# Patient Record
Sex: Male | Born: 1975
Health system: Southern US, Community
[De-identification: ages and names within clinical notes are randomized; demographics above are authoritative.]

## PROBLEM LIST (undated history)

## (undated) DIAGNOSIS — E785 Hyperlipidemia, unspecified: Secondary | ICD-10-CM

## (undated) DIAGNOSIS — E782 Mixed hyperlipidemia: Secondary | ICD-10-CM

## (undated) DIAGNOSIS — F988 Other specified behavioral and emotional disorders with onset usually occurring in childhood and adolescence: Secondary | ICD-10-CM

## (undated) DIAGNOSIS — G4700 Insomnia, unspecified: Secondary | ICD-10-CM

## (undated) DIAGNOSIS — K219 Gastro-esophageal reflux disease without esophagitis: Secondary | ICD-10-CM

## (undated) DIAGNOSIS — I10 Essential (primary) hypertension: Secondary | ICD-10-CM

## (undated) DIAGNOSIS — J309 Allergic rhinitis, unspecified: Secondary | ICD-10-CM

## (undated) DIAGNOSIS — D509 Iron deficiency anemia, unspecified: Secondary | ICD-10-CM

## (undated) DIAGNOSIS — R7303 Prediabetes: Secondary | ICD-10-CM

## (undated) DIAGNOSIS — R001 Bradycardia, unspecified: Secondary | ICD-10-CM

## (undated) DIAGNOSIS — E079 Disorder of thyroid, unspecified: Secondary | ICD-10-CM

## (undated) DIAGNOSIS — I471 Supraventricular tachycardia, unspecified: Secondary | ICD-10-CM

## (undated) DIAGNOSIS — F32A Depression, unspecified: Secondary | ICD-10-CM

## (undated) DIAGNOSIS — E039 Hypothyroidism, unspecified: Secondary | ICD-10-CM

## (undated) HISTORY — DX: Hypothyroidism, unspecified: E03.9

## (undated) HISTORY — DX: Gastro-esophageal reflux disease without esophagitis: K21.9

## (undated) HISTORY — DX: Depression, unspecified: F32.A

## (undated) HISTORY — DX: Prediabetes: R73.03

## (undated) HISTORY — DX: Bradycardia, unspecified: R00.1

## (undated) HISTORY — DX: Disorder of thyroid, unspecified: E07.9

## (undated) HISTORY — DX: Essential (primary) hypertension: I10

## (undated) HISTORY — DX: Mixed hyperlipidemia: E78.2

## (undated) HISTORY — DX: Insomnia, unspecified: G47.00

## (undated) HISTORY — DX: Other specified behavioral and emotional disorders with onset usually occurring in childhood and adolescence: F98.8

## (undated) HISTORY — DX: Iron deficiency anemia, unspecified: D50.9

## (undated) HISTORY — DX: Supraventricular tachycardia, unspecified: I47.10

## (undated) HISTORY — DX: Hyperlipidemia, unspecified: E78.5

## (undated) HISTORY — DX: Allergic rhinitis, unspecified: J30.9

---

## 2003-11-17 ENCOUNTER — Encounter: Admission: RE | Admit: 2003-11-17 | Discharge: 2003-11-17 | Payer: Self-pay | Admitting: Family Medicine

## 2003-12-09 ENCOUNTER — Encounter: Admission: RE | Admit: 2003-12-09 | Discharge: 2003-12-09 | Payer: Self-pay | Admitting: Family Medicine

## 2003-12-23 ENCOUNTER — Encounter: Admission: RE | Admit: 2003-12-23 | Discharge: 2003-12-23 | Payer: Self-pay | Admitting: Family Medicine

## 2004-01-27 ENCOUNTER — Encounter: Admission: RE | Admit: 2004-01-27 | Discharge: 2004-01-27 | Payer: Self-pay | Admitting: Family Medicine

## 2004-01-27 ENCOUNTER — Encounter: Admission: RE | Admit: 2004-01-27 | Discharge: 2004-01-27 | Payer: Self-pay | Admitting: Sports Medicine

## 2004-03-03 ENCOUNTER — Encounter: Admission: RE | Admit: 2004-03-03 | Discharge: 2004-03-03 | Payer: Self-pay | Admitting: Family Medicine

## 2006-09-27 DIAGNOSIS — K219 Gastro-esophageal reflux disease without esophagitis: Secondary | ICD-10-CM | POA: Insufficient documentation

## 2006-09-27 DIAGNOSIS — E669 Obesity, unspecified: Secondary | ICD-10-CM | POA: Insufficient documentation

## 2006-09-27 DIAGNOSIS — J309 Allergic rhinitis, unspecified: Secondary | ICD-10-CM | POA: Insufficient documentation

## 2016-08-27 ENCOUNTER — Encounter (HOSPITAL_COMMUNITY): Payer: Self-pay | Admitting: Emergency Medicine

## 2016-08-27 ENCOUNTER — Emergency Department (HOSPITAL_COMMUNITY)
Admission: EM | Admit: 2016-08-27 | Discharge: 2016-08-27 | Disposition: A | Discharge status: Home / Self Care | Payer: Commercial Managed Care - PPO | Attending: Emergency Medicine | Admitting: Emergency Medicine

## 2016-08-27 ENCOUNTER — Emergency Department (HOSPITAL_COMMUNITY): Payer: Commercial Managed Care - PPO

## 2016-08-27 DIAGNOSIS — Y9289 Other specified places as the place of occurrence of the external cause: Secondary | ICD-10-CM | POA: Diagnosis not present

## 2016-08-27 DIAGNOSIS — Y9389 Activity, other specified: Secondary | ICD-10-CM | POA: Diagnosis not present

## 2016-08-27 DIAGNOSIS — M79676 Pain in unspecified toe(s): Secondary | ICD-10-CM | POA: Diagnosis not present

## 2016-08-27 DIAGNOSIS — W1849XA Other slipping, tripping and stumbling without falling, initial encounter: Secondary | ICD-10-CM | POA: Insufficient documentation

## 2016-08-27 DIAGNOSIS — S92511A Displaced fracture of proximal phalanx of right lesser toe(s), initial encounter for closed fracture: Secondary | ICD-10-CM | POA: Insufficient documentation

## 2016-08-27 DIAGNOSIS — Y999 Unspecified external cause status: Secondary | ICD-10-CM | POA: Diagnosis not present

## 2016-08-27 DIAGNOSIS — S93104A Unspecified dislocation of right toe(s), initial encounter: Secondary | ICD-10-CM | POA: Diagnosis not present

## 2016-08-27 DIAGNOSIS — S99921A Unspecified injury of right foot, initial encounter: Secondary | ICD-10-CM | POA: Diagnosis present

## 2016-08-27 DIAGNOSIS — S92501A Displaced unspecified fracture of right lesser toe(s), initial encounter for closed fracture: Secondary | ICD-10-CM

## 2016-08-27 DIAGNOSIS — S92911A Unspecified fracture of right toe(s), initial encounter for closed fracture: Secondary | ICD-10-CM | POA: Diagnosis not present

## 2016-08-27 MED ORDER — LIDOCAINE HCL (PF) 1 % IJ SOLN
5.0000 mL | Freq: Once | INTRAMUSCULAR | Status: AC
  Administered 2016-08-27: 5 mL
  Filled 2016-08-27: qty 5

## 2016-08-27 MED ORDER — HYDROCODONE-ACETAMINOPHEN 5-325 MG PO TABS
1.0000 | ORAL_TABLET | Freq: Once | ORAL | Status: AC
  Administered 2016-08-27: 1 via ORAL
  Filled 2016-08-27: qty 1

## 2016-08-27 MED ORDER — TRAMADOL HCL 50 MG PO TABS: 50.0000 mg | ORAL_TABLET | Freq: Four times a day (QID) | ORAL | 0 refills | Status: AC | PRN

## 2016-08-27 MED ORDER — DICLOFENAC SODIUM 50 MG PO TBEC: 50.0000 mg | DELAYED_RELEASE_TABLET | Freq: Two times a day (BID) | ORAL | 0 refills | Status: AC

## 2016-08-27 NOTE — ED Triage Notes (Signed)
Pt here from [facility] with right pinky toe injury after hitting it on a laundry basket

## 2016-08-27 NOTE — Progress Notes (Signed)
Orthopedic Tech Progress Note Patient Details:  Roger Shepherd Nov 15, 1975 [id number]  Ortho Devices Type of Ortho Device: Buddy tape, Postop shoe/boot Ortho Device/Splint Location: rle 4th and 5th toes Ortho Device/Splint Interventions: Ordered, Application   Karolee Stamps 08/27/2016, 8:57 PM

## 2016-08-27 NOTE — ED Provider Notes (Signed)
[facility] DEPT Provider Note   CSN: [id number] Arrival date & time: 08/27/16  1642   By signing my name below, I, Ryan Long and Hansel Feinstein , attest that this documentation has been prepared under the direction and in the presence of [place]. Janit Bern, NP Electronically Signed: Vergia Alcon and Hansel Feinstein , Scribe. 08/27/2016. 6:40 PM.   History   Chief Complaint Chief Complaint  Patient presents with  . Toe Pain    The history is provided by the patient and the spouse. No language interpreter was used.  Toe Pain  This is a new problem. The current episode started 6 to 12 hours ago. The problem occurs constantly. The problem has not changed since onset.Pertinent negatives include no headaches. The symptoms are aggravated by bending, walking and standing. The symptoms are relieved by rest. He has tried nothing for the symptoms. The treatment provided no relief.    HPI Comments:  Roger Shepherd is a 41 y.o. male who presents to the Emergency Department complaining of gradually worsening right pinky toe pain s/p injury that occurred this morning. The pt states he slipped while stretching near the ground, drug his toe underneath him and struck the laundry basket with his right pinky toe. He denies LOC or head injury. He is unable to ambulate post event without difficulty. He states his pain is worsened with ambulation, weight bearing and toe movement. Pt was seen at UC earlier today, had XR showing fracture and dislocation and was referred here for further management. He denies HA, numbness, additional injuries.   History reviewed. No pertinent past medical history.  Patient Active Problem List   Diagnosis Date Noted  . OBESITY, NOS 09/27/2006  . RHINITIS, ALLERGIC 09/27/2006  . GASTROESOPHAGEAL REFLUX, NO ESOPHAGITIS 09/27/2006    History reviewed. No pertinent surgical history.   Home Medications    Prior to Admission medications   Medication Sig Start Date End Date Taking?  Authorizing Provider  diclofenac (VOLTAREN) 50 MG EC tablet Take 1 tablet (50 mg total) by mouth 2 (two) times daily. 08/27/16   Braylen Denunzio Bunnie Pion, NP  traMADol (ULTRAM) 50 MG tablet Take 1 tablet (50 mg total) by mouth every 6 (six) hours as needed. 08/27/16   Nandini Bogdanski Bunnie Pion, NP    Family History History reviewed. No pertinent family history.  Social History Social History  Substance Use Topics  . Smoking status: Never Smoker  . Smokeless tobacco: Never Used  . Alcohol use No     Allergies   Patient has no known allergies.   Review of Systems Review of Systems  Gastrointestinal: Negative for nausea and vomiting.  Musculoskeletal: Positive for arthralgias (right 5th toe).  Skin: Negative for wound.  Neurological: Negative for numbness and headaches.     Physical Exam Updated Vital Signs BP 115/60   Pulse (!) 55   Temp 98 F (36.7 C) (Oral)   Resp 20   SpO2 100%   Physical Exam  Constitutional: He is oriented to person, place, and time. He appears well-developed and well-nourished. No distress.  HENT:  Head: Normocephalic.  Eyes: Conjunctivae are normal.  Neck: Neck supple.  Cardiovascular: Bradycardia present.   Pulmonary/Chest: Effort normal.  Musculoskeletal:       Right foot: There is decreased range of motion, tenderness, bony tenderness, swelling and deformity. There is normal capillary refill.       Feet:  There is tenderness with palpation of the right little toe. There is deformity noted at the  distal aspect.   Neurological: He is alert and oriented to person, place, and time.  Skin: Skin is warm and dry.  Psychiatric: He has a normal mood and affect. His behavior is normal.  Nursing note and vitals reviewed.    ED Treatments / Results  DIAGNOSTIC STUDIES:  Oxygen Saturation is 100% on RA, normal by my interpretation.    COORDINATION OF CARE:  6:33 PM Discussed treatment plan with pt at bedside and pt agreed to plan.  Labs (all labs ordered are  listed, but only abnormal results are displayed) Labs Reviewed - No data to display   Radiology Dg Toe 5th Right  Result Date: 08/27/2016 CLINICAL DATA:  Postreduction little toe fracture. EXAM: RIGHT FIFTH TOE COMPARISON:  08/27/2016 FINDINGS: An oblique/vertical fracture of the little toe proximal phalanx is unchanged with apex medial angulation. No subluxation or dislocation identified. IMPRESSION: Unchanged fracture of the little toe proximal phalanx. Electronically Signed   By: Margarette Canada M.D.   On: 08/27/2016 20:19    Procedures Reduction of dislocation Date/Time: 08/27/2016 7:07 PM Performed by: Ashley Murrain Authorized by: Ashley Murrain  Consent: Verbal consent obtained. Risks and benefits: risks, benefits and alternatives were discussed Patient understanding: patient states understanding of the procedure being performed Imaging studies: imaging studies available Required items: required blood products, implants, devices, and special equipment available Patient identity confirmed: verbally with patient and hospital-assigned identification number Preparation: Patient was prepped and draped in the usual sterile fashion. Local anesthesia used: yes Anesthesia: digital block  Anesthesia: Local anesthesia used: yes Local Anesthetic: lidocaine 1% without epinephrine Anesthetic total: 4 mL  Sedation: Patient sedated: no Patient tolerance: Patient tolerated the procedure well with no immediate complications Comments: Dislocation reduced without difficulty     Medications Ordered in ED Medications  lidocaine (PF) (XYLOCAINE) 1 % injection 5 mL (5 mLs Other Given 08/27/16 1909)  HYDROcodone-acetaminophen (NORCO/VICODIN) 5-325 MG per tablet 1 tablet (1 tablet Oral Given 08/27/16 2052)     Initial Impression / Assessment and Plan / ED Course  I have reviewed the triage vital signs and the nursing notes.  Pertinent imaging results that were available during my care of the  patient were reviewed by me and considered in my medical decision making (see chart for details).     Patient X-Ray from UC shows oblique fracture though the distal aspect of the fifth proximal phalanx. Mild angulation at the fracture site is noted. Reduction performed in the Ed. Repeat XR shows oblique/vertical fracture of the little toe proximal phalanx is unchanged with apex medial angulation. No subluxation or dislocation identified. Pt advised to follow up with orthopedics. Patient given post-op shoe while in ED, conservative therapy recommended and discussed. Patient will be discharged home & is agreeable with above plan. Returns precautions discussed. Pt appears safe for discharge.   Final Clinical Impressions(s) / ED Diagnoses   Final diagnoses:  Fracture of fifth toe, right, closed, initial encounter  Closed dislocation of fifth toe of right foot, initial encounter    New Prescriptions Discharge Medication List as of 08/27/2016  8:38 PM    START taking these medications   Details  diclofenac (VOLTAREN) 50 MG EC tablet Take 1 tablet (50 mg total) by mouth 2 (two) times daily., Starting Sun 08/27/2016, Print    traMADol (ULTRAM) 50 MG tablet Take 1 tablet (50 mg total) by mouth every 6 (six) hours as needed., Starting Sun 08/27/2016, Print       I personally performed the  services described in this documentation, which was scribed in my presence. The recorded information has been reviewed and is accurate.     [address] [place], [place] 08/28/16 [id number]    Pattricia Boss, MD 08/28/16 [phone]

## 2016-08-27 NOTE — ED Notes (Addendum)
Patient transported to X-ray 

## 2016-08-27 NOTE — ED Notes (Signed)
Pt understood dc material. NAD noted. Scripts given at dc 

## 2016-08-28 ENCOUNTER — Encounter (INDEPENDENT_AMBULATORY_CARE_PROVIDER_SITE_OTHER): Payer: Self-pay | Admitting: Orthopaedic Surgery

## 2016-08-28 ENCOUNTER — Ambulatory Visit (INDEPENDENT_AMBULATORY_CARE_PROVIDER_SITE_OTHER): Payer: Commercial Managed Care - PPO | Admitting: Orthopaedic Surgery

## 2016-08-28 DIAGNOSIS — S92501A Displaced unspecified fracture of right lesser toe(s), initial encounter for closed fracture: Secondary | ICD-10-CM

## 2016-08-28 NOTE — Progress Notes (Signed)
Office Visit Note   Patient: Roger Shepherd           Date of Birth: May 13, 1976           MRN: [id number] Visit Date: 08/28/2016              Requested by: Michel Harrow, PA-C [place], [place] [address] PCP: Michel Harrow, PA-C   Assessment & Plan: Visit Diagnoses:  1. Closed fracture of phalanx of right fifth toe, initial encounter     Plan: We'll plan on trying this nonoperatively with buddy taping to the fourth toe. Weight-bear as tolerated in a postop shoe. Follow-up in 4 weeks for recheck. No x-rays needed unless he is having issues.  Follow-Up Instructions: Return in about 4 weeks (around 09/25/2016).   Orders:  No orders of the defined types were placed in this encounter.  No orders of the defined types were placed in this encounter.     Procedures: No procedures performed   Clinical Data: No additional findings.   Subjective: Chief Complaint  Patient presents with  . Right 5th Toe - Pain, Fracture    Roger Shepherd is a 41 yo male who presents with a chief complaint of right 5th toe fracture. Patient injured his toe on 08/27/16 while stretching. He initially presented to an Urgent Care and was sent to the ED for 5th toe dislocation and fracture. Dislocation was reduced in the ED. He ranks the pain as high as a 5/10, at its worst when walking. Denies numbness but endorses some tingling and burning on the lateral aspect of the 5th toe. He has taken Vicodin and Ibuprofen with some relief. He is currently wearing a post-op shoe.     Review of Systems Complete review of systems negative except for history of present illness  Objective: Vital Signs: There were no vitals taken for this visit.  Physical Exam  Constitutional: He is oriented to person, place, and time. He appears well-developed and well-nourished.  HENT:  Head: Normocephalic and atraumatic.  Eyes: Pupils are equal, round, and reactive to light.  Neck: Neck  supple.  Pulmonary/Chest: Effort normal.  Abdominal: Soft.  Musculoskeletal: Normal range of motion.  Neurological: He is alert and oriented to person, place, and time.  Skin: Skin is warm.  Psychiatric: He has a normal mood and affect. His behavior is normal. Judgment and thought content normal.  Nursing note and vitals reviewed.   Ortho Exam Exam of the right fifth toe shows overall clinically straight the toe is neuro vascularly intact. Specialty Comments:  No specialty comments available.  Imaging: Dg Toe 5th Right  Result Date: 08/27/2016 CLINICAL DATA:  Postreduction little toe fracture. EXAM: RIGHT FIFTH TOE COMPARISON:  08/27/2016 FINDINGS: An oblique/vertical fracture of the little toe proximal phalanx is unchanged with apex medial angulation. No subluxation or dislocation identified. IMPRESSION: Unchanged fracture of the little toe proximal phalanx. Electronically Signed   By: Margarette Canada M.D.   On: 08/27/2016 20:19     PMFS History: Patient Active Problem List   Diagnosis Date Noted  . Closed fracture of fifth toe of right foot 08/28/2016  . OBESITY, NOS 09/27/2006  . RHINITIS, ALLERGIC 09/27/2006  . GASTROESOPHAGEAL REFLUX, NO ESOPHAGITIS 09/27/2006   History reviewed. No pertinent past medical history.  History reviewed. No pertinent family history.  History reviewed. No pertinent surgical history. Social History   Occupational History  . Not on file.   Social History Main Topics  .  Smoking status: Never Smoker  . Smokeless tobacco: Never Used  . Alcohol use No  . Drug use: No  . Sexual activity: Not on file

## 2016-08-31 ENCOUNTER — Ambulatory Visit (INDEPENDENT_AMBULATORY_CARE_PROVIDER_SITE_OTHER): Payer: Self-pay | Admitting: Orthopedic Surgery

## 2016-09-08 DIAGNOSIS — K219 Gastro-esophageal reflux disease without esophagitis: Secondary | ICD-10-CM | POA: Diagnosis not present

## 2016-09-08 DIAGNOSIS — Z0000 Encounter for general adult medical examination without abnormal findings: Secondary | ICD-10-CM | POA: Diagnosis not present

## 2016-09-08 DIAGNOSIS — J309 Allergic rhinitis, unspecified: Secondary | ICD-10-CM | POA: Diagnosis not present

## 2016-09-25 ENCOUNTER — Encounter (INDEPENDENT_AMBULATORY_CARE_PROVIDER_SITE_OTHER): Payer: Self-pay | Admitting: Orthopaedic Surgery

## 2016-09-25 ENCOUNTER — Ambulatory Visit (INDEPENDENT_AMBULATORY_CARE_PROVIDER_SITE_OTHER): Payer: Commercial Managed Care - PPO | Admitting: Orthopaedic Surgery

## 2016-09-25 DIAGNOSIS — S92501A Displaced unspecified fracture of right lesser toe(s), initial encounter for closed fracture: Secondary | ICD-10-CM

## 2016-09-25 NOTE — Progress Notes (Signed)
Patient f/u today for right 5th toe fracture.  Doing well.  Some mild ache.  Toe is non swollen and nonttp.  Will wean postop shoe and buddy taping.  F/u prn.  Increase activity as tolerated.

## 2016-11-06 DIAGNOSIS — H5213 Myopia, bilateral: Secondary | ICD-10-CM | POA: Diagnosis not present

## 2017-09-10 DIAGNOSIS — J309 Allergic rhinitis, unspecified: Secondary | ICD-10-CM | POA: Diagnosis not present

## 2017-09-10 DIAGNOSIS — Z23 Encounter for immunization: Secondary | ICD-10-CM | POA: Diagnosis not present

## 2017-09-10 DIAGNOSIS — E782 Mixed hyperlipidemia: Secondary | ICD-10-CM | POA: Diagnosis not present

## 2017-09-10 DIAGNOSIS — Z136 Encounter for screening for cardiovascular disorders: Secondary | ICD-10-CM | POA: Diagnosis not present

## 2017-09-10 DIAGNOSIS — Z0001 Encounter for general adult medical examination with abnormal findings: Secondary | ICD-10-CM | POA: Diagnosis not present

## 2017-11-06 DIAGNOSIS — H524 Presbyopia: Secondary | ICD-10-CM | POA: Diagnosis not present

## 2017-11-06 DIAGNOSIS — H5213 Myopia, bilateral: Secondary | ICD-10-CM | POA: Diagnosis not present

## 2018-03-04 DIAGNOSIS — M79672 Pain in left foot: Secondary | ICD-10-CM | POA: Diagnosis not present

## 2018-03-05 ENCOUNTER — Encounter (INDEPENDENT_AMBULATORY_CARE_PROVIDER_SITE_OTHER): Payer: Self-pay | Admitting: Physician Assistant

## 2018-03-05 ENCOUNTER — Ambulatory Visit (INDEPENDENT_AMBULATORY_CARE_PROVIDER_SITE_OTHER): Payer: 59 | Admitting: Physician Assistant

## 2018-03-05 ENCOUNTER — Ambulatory Visit (INDEPENDENT_AMBULATORY_CARE_PROVIDER_SITE_OTHER): Payer: Self-pay

## 2018-03-05 DIAGNOSIS — M79672 Pain in left foot: Secondary | ICD-10-CM | POA: Diagnosis not present

## 2018-03-05 MED ORDER — TRAMADOL HCL 50 MG PO TABS
ORAL_TABLET | ORAL | 0 refills | Status: AC
Start: 2018-03-05 — End: ?

## 2018-03-05 NOTE — Progress Notes (Signed)
   Office Visit Note   Patient: Roger Shepherd           Date of Birth: 11-17-1975           MRN: [id number] Visit Date: 03/05/2018              Requested by: Michel Harrow, PA-C [place], [place] [address] PCP: Michel Harrow, PA-C   Assessment & Plan: Visit Diagnoses:  1. Pain in left foot     Plan: Impression is questionable fourth metatarsal shaft fracture versus sprain of the foot.  We will place the patient in a cam walker weightbearing as tolerated.  He will ice and elevate for pain and swelling.  I have called in a prescription of tramadol.  He does note that he is leaving for [place] in approximately 3 weeks and will follow up with [place] just prior to his departure.  Call if concerns or questions in the meantime  Follow-Up Instructions: Return in about 3 weeks (around 03/26/2018).   Orders:  Orders Placed This Encounter  Procedures  . XR Foot Complete Left   Meds ordered this encounter  Medications  . traMADol (ULTRAM) 50 MG tablet    Sig: TAKE 1-2 TABS PO Q6-8 HOURS PRN PAIN    Dispense:  30 tablet    Refill:  0      Procedures: No procedures performed   Clinical Data: No additional findings.   Subjective: Chief Complaint  Patient presents with  . Left Foot - Pain    HPI patient is a pleasant 42 year old gentleman who presents our clinic today with left foot pain.  He notes that he was running this past Sunday night when he inverted his left ankle.  He had immediate pain and swelling to the left lateral foot.  He has been weightbearing utilizing a postoperative shoe and crutches.  Having pain with this.  He describes this as a constant throb.  He has been taking Tylenol without any relief of symptoms.  No numbness, tingling or burning.  Review of Systems as detailed in HPI.  All others reviewed and are negative.   Objective: Vital Signs: There were no vitals taken for this visit.  Physical Exam well-developed well-nourished  gentleman no acute distress.  Alert and oriented x3.  Ortho Exam examination of the left foot reveals marked tenderness to the fourth metatarsal shaft.  Minimal tenderness to the peroneal tendon.  Minimal tenderness to the ATFL.  No bony tenderness.  Full range of motion of the ankle.  He is neurovascular intact distally.  Specialty Comments:  No specialty comments available.  Imaging: Xr Foot Complete Left  Result Date: 03/05/2018 Questionable nondisplaced fracture fourth metatarsal shaft    PMFS History: Patient Active Problem List   Diagnosis Date Noted  . Pain in left foot 03/05/2018  . Closed fracture of fifth toe of right foot 08/28/2016  . OBESITY, NOS 09/27/2006  . RHINITIS, ALLERGIC 09/27/2006  . GASTROESOPHAGEAL REFLUX, NO ESOPHAGITIS 09/27/2006   History reviewed. No pertinent past medical history.  History reviewed. No pertinent family history.  History reviewed. No pertinent surgical history. Social History   Occupational History  . Not on file  Tobacco Use  . Smoking status: Never Smoker  . Smokeless tobacco: Never Used  Substance and Sexual Activity  . Alcohol use: No  . Drug use: No  . Sexual activity: Not on file

## 2018-03-26 ENCOUNTER — Ambulatory Visit (INDEPENDENT_AMBULATORY_CARE_PROVIDER_SITE_OTHER): Payer: 59

## 2018-03-26 ENCOUNTER — Ambulatory Visit (INDEPENDENT_AMBULATORY_CARE_PROVIDER_SITE_OTHER): Payer: 59 | Admitting: Physician Assistant

## 2018-03-26 ENCOUNTER — Encounter (INDEPENDENT_AMBULATORY_CARE_PROVIDER_SITE_OTHER): Payer: Self-pay | Admitting: Orthopaedic Surgery

## 2018-03-26 DIAGNOSIS — M79672 Pain in left foot: Secondary | ICD-10-CM | POA: Diagnosis not present

## 2018-03-26 MED ORDER — DICLOFENAC SODIUM 1 % TD GEL
2.0000 g | Freq: Four times a day (QID) | TRANSDERMAL | 1 refills | Status: AC
Start: 2018-03-26 — End: ?

## 2018-03-26 NOTE — Progress Notes (Signed)
   Office Visit Note   Patient: Roger Shepherd           Date of Birth: Dec 10, 1975           MRN: [id number] Visit Date: 03/26/2018              Requested by: Michel Harrow, PA-C [place], [place] [address] PCP: Michel Harrow, PA-C   Assessment & Plan: Visit Diagnoses:  1. Pain in left foot     Plan: At this point, we will transition the patient from his cam walker into an ASO.  He will remain weightbearing as tolerated.  He will elevate for swelling.  He will follow-up with [place] as needed.  Call with concerns or questions.  Follow-Up Instructions: Return if symptoms worsen or fail to improve.   Orders:  Orders Placed This Encounter  Procedures  . XR Foot Complete Left   No orders of the defined types were placed in this encounter.     Procedures: No procedures performed   Clinical Data: No additional findings.   Subjective: Chief Complaint  Patient presents with  . Left Foot - Pain    HPI patient is a pleasant 42 year old gentleman who presents to our clinic today for recheck of his left foot.  Questionable left foot sprain versus fourth metatarsal shaft fracture about 3 weeks ago.  He is placed in a cam walker [place] weightbearing as tolerated.  He has much improved pain over the past few weeks.  He has tried walking around at his house barefoot without any issues.  He does note occasional pain along the peroneal attachment at the navicular.  Overall, doing much better.  Review of Systems as detailed in HPI.  All others reviewed and are negative.   Objective: Vital Signs: There were no vitals taken for this visit.  Physical Exam well-developed well-nourished gentleman no acute distress.  Alert and oriented x3.  Ortho Exam examination of the left foot reveals no swelling.  Full range of motion.  Mild tenderness along the peroneal tendon.  No tenderness along the fourth metatarsal.  He is neurovascularly intact distally.  Specialty  Comments:  No specialty comments available.  Imaging: Xr Foot Complete Left  Result Date: 03/26/2018 No acute or structural abnormalities    PMFS History: Patient Active Problem List   Diagnosis Date Noted  . Pain in left foot 03/05/2018  . Closed fracture of fifth toe of right foot 08/28/2016  . OBESITY, NOS 09/27/2006  . RHINITIS, ALLERGIC 09/27/2006  . GASTROESOPHAGEAL REFLUX, NO ESOPHAGITIS 09/27/2006   History reviewed. No pertinent past medical history.  History reviewed. No pertinent family history.  History reviewed. No pertinent surgical history. Social History   Occupational History  . Not on file  Tobacco Use  . Smoking status: Never Smoker  . Smokeless tobacco: Never Used  Substance and Sexual Activity  . Alcohol use: No  . Drug use: No  . Sexual activity: Not on file

## 2018-05-05 DIAGNOSIS — Z23 Encounter for immunization: Secondary | ICD-10-CM | POA: Diagnosis not present

## 2018-09-08 DIAGNOSIS — J019 Acute sinusitis, unspecified: Secondary | ICD-10-CM | POA: Diagnosis not present

## 2018-09-08 DIAGNOSIS — J9801 Acute bronchospasm: Secondary | ICD-10-CM | POA: Diagnosis not present

## 2018-09-23 DIAGNOSIS — R718 Other abnormality of red blood cells: Secondary | ICD-10-CM | POA: Diagnosis not present

## 2018-09-23 DIAGNOSIS — E782 Mixed hyperlipidemia: Secondary | ICD-10-CM | POA: Diagnosis not present

## 2018-09-23 DIAGNOSIS — E669 Obesity, unspecified: Secondary | ICD-10-CM | POA: Diagnosis not present

## 2018-09-23 DIAGNOSIS — R7303 Prediabetes: Secondary | ICD-10-CM | POA: Diagnosis not present

## 2018-09-23 DIAGNOSIS — Z0000 Encounter for general adult medical examination without abnormal findings: Secondary | ICD-10-CM | POA: Diagnosis not present

## 2018-09-23 DIAGNOSIS — R7301 Impaired fasting glucose: Secondary | ICD-10-CM | POA: Diagnosis not present

## 2018-10-03 ENCOUNTER — Encounter: Payer: Self-pay | Admitting: Dietician

## 2018-10-03 ENCOUNTER — Encounter: Payer: 59 | Attending: Family Medicine | Admitting: Dietician

## 2018-10-03 DIAGNOSIS — E669 Obesity, unspecified: Secondary | ICD-10-CM | POA: Diagnosis not present

## 2018-10-03 NOTE — Patient Instructions (Addendum)
Goals:   To drink soda no more than 3 times per week.   To order water (in addition to soda) at restaurants and start with the water first, then only drink soda if needed for flavor.   To order the entree only at fast food breakfast and bring your own side (fruit) and sugar-free lemonade flavor packet for water.   To look up new, easy recipes that create the "MyPlate." Include non-starchy vegetables, protein, and complex carbohydrates (whole grains, whole fruits, and low-fat dairy.)   Use your handouts from today for help/guidance. Let me know if you have any questions! See you at your follow up in about 2 months.

## 2018-10-03 NOTE — Progress Notes (Signed)
Medical Nutrition Therapy  Appt Start Time: 8:10am End Time: 9:10am   Primary concerns today: prediabetes, weight management   Preferred learning style: no preference indicated Learning readiness: ready   NUTRITION ASSESSMENT   Anthropometrics  Pt declined; taken from 09/23/2018:  Weight: 232 lbs  Height: 69 in  BMI: 34.3 kg/m2   Clinical Medical Hx: high cholesterol, impaired fasting glucose/prediabetes (no family hx), HTN, GERD, obesity, depression Surgeries: N/A Allergies: N/A Medications: see list  Psychosocial/Lifestyle Pt is not married nor have children. 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 Pt states he was told a couple of weeks ago at his doctor appointment that he is within the prediabetes range based on his fasting BG level. Pt was well prepared for the visit and asked many questions, including expressing his concern over his elevated BG.    24-Hr Dietary Recall First Meal: whole wheat english muffin (w/ sausage + cheese + egg) + banana + Coke Snack: candy  Second Meal: whole wheat ham & cheese sandwich + banana + chips + Coke  Snack: candy  Third Meal: pizza (or [employer])  Snack: usually skips  Beverages: Coke, water    Food & Nutrition Related Hx Dietary Hx: Likes eggs, sandwiches, chips, crunchy foods. Doesn't like oatmeal, fish, salad. Shops at [employer]. Pt states he uses whole grain bread for sandwiches. Pt states he drinks 2 Cokes/day in addition to lots of water. Pt states he experiences heartburn after eating a lot of sugar.  Estimated Daily Fluid Intake: 100+ oz Supplements: vitamin D, calcium, magnesium  GI / Other Notable Symptoms: heartburn   Physical Activity  Current average weekly physical activity: running 2x/week, yoga 2x/week   Estimated Energy Needs Calories: 2200 Carbohydrate: 248g Protein: 165g Fat:  61g   NUTRITION DIAGNOSIS  Food and nutrition related knowledge deficit (NB-1.1) related to limited prior nutrition-related education as evidenced by verbalized incomplete information and no prior knowledge of need for nutrition related recommendations.    NUTRITION INTERVENTION  Nutrition education (E-1) on the following topics:  . General healthful eating: MyPlate, food groups, variety, balanced eating, meal/snack ideas  . Prediabetes: what it is, how to manage with food/ exercise/ lifestyle   Handouts Provided Include   MyPlate   Meal Ideas   Breakfast Ideas   Sip Smarter   Diabetes Types  Learning Style & Readiness for Change Teaching method utilized: Visual & Auditory  Demonstrated degree of understanding via: Teach Back  Barriers to learning/adherence to lifestyle change: None Identified   Goals Established by Pt  To drink soda no more than 3 times per week.   To order water (in addition to soda) at restaurants and start with the water first, then only drink soda if needed for flavor.   To order the entree only at fast food breakfast and bring your own side (fruit) and sugar-free lemonade flavor packet for water.   To look up new, easy recipes that create the "MyPlate." Include non-starchy vegetables, protein, and complex carbohydrates (whole grains, whole fruits, and low-fat dairy.)    MONITORING & EVALUATION Dietary intake, weekly physical activity, and goals in 2 months.  RD's Notes for Next Visit  . Handouts: Balanced Snacks, Sugary Beverages  . Teach more about complex carbohydrates and amounts/portions  . Stress  Next Steps  Patient is to return to [facility] in about 2 months for  follow up appointment.

## 2018-10-15 DIAGNOSIS — M546 Pain in thoracic spine: Secondary | ICD-10-CM | POA: Diagnosis not present

## 2018-12-05 ENCOUNTER — Encounter: Payer: 59 | Attending: Family Medicine | Admitting: Dietician

## 2018-12-05 VITALS — Ht 69.0 in | Wt 213.0 lb

## 2018-12-05 DIAGNOSIS — E669 Obesity, unspecified: Secondary | ICD-10-CM | POA: Insufficient documentation

## 2018-12-05 NOTE — Patient Instructions (Signed)
   Review the "Types of Sugar" handout attached for more information on different types and sources of sugar and how they may impact the body.   Keep desserts and other sweets in moderation, no more than 2-3 times/week. Remember to stick to serving sizes as well and focus on balancing sweet treats out with other foods you eat during the day. If you want to try low-calorie versions of desserts (such as ice cream) this could be a way to incorporate them more often as well.   An appropriate weight loss rate is between 0.5 and 1.5 pounds per week, so be careful and don't rush. Remember there are a lot of things that make up and impact our body weight, including our food/beverage intake, physical activity, stress, water, etc. Fluctuations are normal!   If you need some snack ideas, see the "Balanced Snack" handout attached. These are good go-to options to have at the house if you need something to hold you over between meals. They pair up to provide good sources of both carbohydrates and protein.   For some of our carbohydrate foods, whole grain versions are best. Here are some examples:   Whole grains are best when it comes to bread, and if you are wanting to cut back on the amount that you eat try "Sandwich Thins" as a way to enjoy sandwiches without as much bread.   Pastas also have whole-grain versions as well as noodles made out of beans, chickpeas, and even vegetables like zucchini.   Brown rice is the preferred, whole-grain version over white rice, and there is also cauliflower rice.   Many foods have these substitutions as a way to get in more beans and vegetables, which will help increase fiber intake!     See you in about 4 months at your next follow up!

## 2018-12-05 NOTE — Progress Notes (Signed)
Medical Nutrition Therapy  Follow-Up Visit Appt Start Time: 8:10am End Time: 8:40am  Telephone Visit This visit was completed via telephone due to the COVID-19 pandemic.   I spoke with Arlyss Queen and verified that I was speaking with the correct person with two patient identifiers (full name and date of birth).   I discussed the limitations related to this kind of visit and the patient is willing to proceed.  Primary concerns today: prediabetes, weight management    NUTRITION ASSESSMENT   Anthropometrics  Weight: 213 lbs (-19 lbs in last 2 months)  Height: 69 in BMI: 31.45  Clinical Medical Hx: high cholesterol, impaired fasting glucose/prediabetes (no family hx), HTN, GERD, obesity, depression  Psychosocial/Lifestyle Pt is not married nor has children. 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 Pt states he was told in March 2020 at his doctor appointment that he is within the prediabetes range based on his fasting BG level. Pt states he likes having benchmarks and goals to work towards to track his progress over time.   24-Hr Dietary Recall First Meal: whole wheat english muffin + cheese + egg + ham + spinach (or McDonalds McMuffin) Snack: none Second Meal: salad (or sandwich roll up on whole wheat wrap)  Snack: none Third Meal: PB&J roll up (or chicken + vegetables)  Snack: none Beverages: sparkling flavored water, Crystal Light sweet tea packets, Coke Zero  Food & Nutrition Related Hx Dietary Hx: Likes eggs, sandwiches, chips, crunchy foods. Doesn't like oatmeal, fish, salad. Shops at [employer]. During the pandemic, has been getting more takeout. Pt states he has drastically reduced his soda intake from 2 Cokes/day to maybe 1 every couple of weeks. If he is out to eat, will have Diet Coke. Pt expressed his frustration with his weight fluctuating, states he  weighs daily.    Estimated Daily Fluid Intake: 100+ oz Supplements: vitamin D, calcium, magnesium  GI / Other Notable Symptoms: heartburn   Physical Activity  Current average weekly physical activity: running 2x/week, yoga 2x/week   Estimated Energy Needs Calories: 2200 Carbohydrate: 248g Protein: 165g Fat: 61g   NUTRITION DIAGNOSIS  Diagnosis on 10/03/2018: Food and nutrition related knowledge deficit (NB-1.1) related to limited prior nutrition-related education as evidenced by verbalized incomplete information and no prior knowledge of need for nutrition related recommendations.  Status: Continue  NUTRITION INTERVENTION  Nutrition education (E-1) on the following topics:  Sugar: types, sources, how they impact the body (using the example of sodas per pt request comparing regular vs diet sodas)   Weight: appropriate rate for weight loss, components of body weight, reasons for fluctuations, why not to focus on weight or weigh daily  Balanced eating: incorporating desserts, best types of bread, snacking, high sugar foods, etc.   Handouts Provided Include   Types of Sugar   Balanced Snacks   After visit summary and handouts were provided to pt via MyChart and email ([email or web address])  Learning Style & Readiness for Change Teaching method utilized: Visual & Auditory  Demonstrated degree of understanding via: Teach Back  Barriers to learning/adherence to lifestyle change: Weight Focused   Progress/Updates on Pt's Goals . Drinking soda less than 3 times/week. Will choose diet soda more often if does have a soda.  . Using the "MyPlate" guide to build meals including non-starchy vegetables, whole grains, and lean proteins.  . Increased vegetable  intake and chooses whole grain bread/wrap/english muffins.  . Maintaining physical activity.  . Avoiding desserts and other sources of added sugar as much as possible.    MONITORING & EVALUATION Dietary intake, weekly  physical activity, and HgbA1c in 4 months.  RD's Notes for Next Visit . Pt plans to have HgbA1c checked in June  . Recipe/meal ideas for incorporating more vegetables and whole grains   Next Steps  Patient is to return to [facility] for follow up visit in 4 months.

## 2019-01-04 IMAGING — CR DG TOE 5TH 2+V*R*
3 series · 3 of 3 positions shown · non-contrast
Comparison: 08/27/2016

CLINICAL DATA: Postreduction little toe fracture.

EXAM:
RIGHT FIFTH TOE

[toe ap]
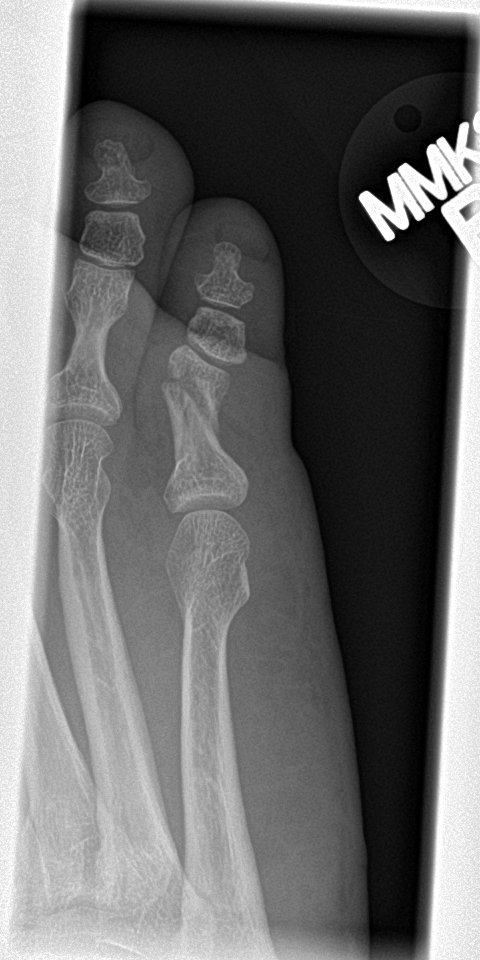

[toe obl]
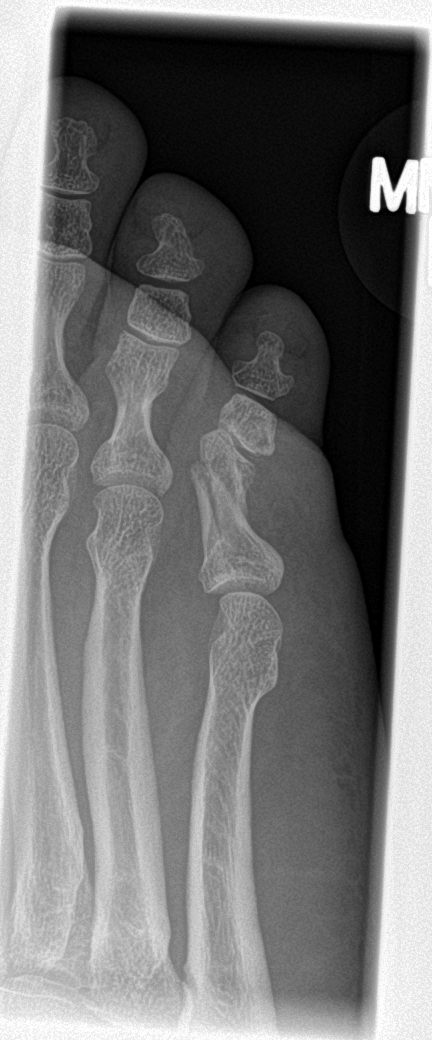

[toe lat]
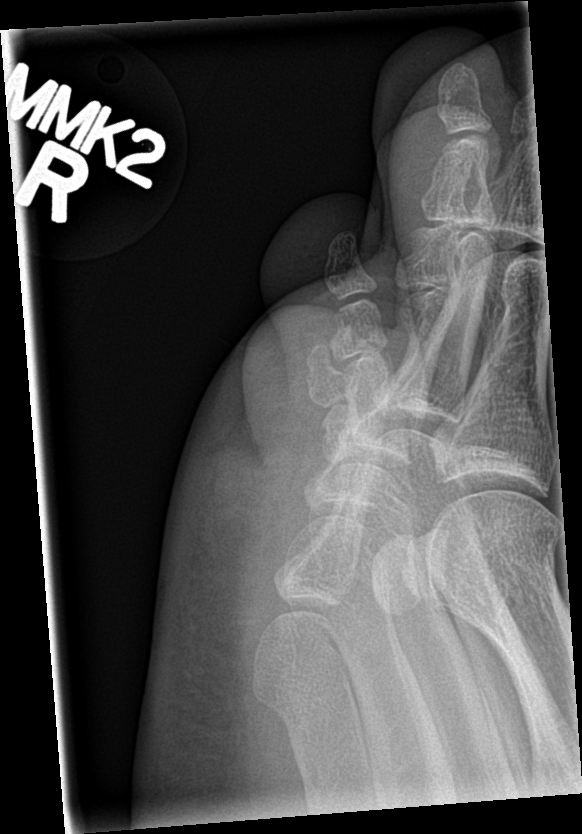

[3 of 3 positions shown; findings below may reference images not displayed]

FINDINGS: An oblique/vertical fracture of the little toe proximal phalanx is
unchanged with apex medial angulation.

No subluxation or dislocation identified.
IMPRESSION: Unchanged fracture of the little toe proximal phalanx.

## 2019-04-01 ENCOUNTER — Other Ambulatory Visit: Payer: Self-pay

## 2019-04-01 ENCOUNTER — Encounter: Payer: 59 | Attending: Family Medicine | Admitting: Dietician

## 2019-04-01 ENCOUNTER — Encounter: Payer: Self-pay | Admitting: Dietician

## 2019-04-01 DIAGNOSIS — E669 Obesity, unspecified: Secondary | ICD-10-CM | POA: Insufficient documentation

## 2019-04-01 NOTE — Progress Notes (Signed)
Medical Nutrition Therapy  Follow-Up Visit Appt Start Time: 8:00am End Time: 8:40am  Primary concerns today: prediabetes, weight management    Patient was initially seen for a nutrition assessment in March 2020 for prediabetes and weight management, then follow up appointment 2 months later in May 2020. Patient states he plans to have blood work done in February 2021 and would like to plan next nutrition appointment immediately following to review his labs.    NUTRITION ASSESSMENT   Anthropometrics  Weight: 198.6 lbs (-14 lbs in last 4 months)  Height: 67 in BMI: 30.8 kg/m2  Body Composition Scale 04/01/2019  Total Body Fat % 28.1     Visceral Fat 16  Fat-Free Mass % 71.8     Total Body Water % 52.8     Muscle-Mass lbs 37.3  Body Fat Displacement          Torso  lbs 34.5         Left Leg  lbs 6.9         Right Leg  lbs 6.9         Left Arm  lbs 3.4         Right Arm   lbs 3.4    Clinical Medical Hx: high cholesterol, impaired fasting glucose/prediabetes (no family hx), HTN, GERD, obesity, depression  Psychosocial/Lifestyle Pt is not married nor has children. 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 Pt states he was told in March 2020 at his doctor appointment that he is within the prediabetes range based on his fasting BG level. Pt states he likes having benchmarks and goals to work towards to track his progress over time.   24-Hr Dietary Recall First Meal: whole wheat english muffin + cheese + sausage Snack: none Second Meal: whole wheat wrap + [place] + cheese + banana + apple Snack: tomatoes + broccoli + carrots Third Meal: vegetables (or salad) + protein   Snack: none Beverages: sparkling flavored water, Crystal Light sweet tea packets, Coke Zero  Food & Nutrition Related Hx Dietary Hx: Pt states he has continued eating his breakfast sandwiches in the  morning. Typically eats whole wheat wrap for lunch along with fruit, snacks on fruit (especially bananas) and vegetables. States he misses chocolate and has not eaten many sweets at all since March. Drinks a lot of water, especially low-calorie flavored water. Always gets a Coke Zero when eats out, will eat out for dinner ~50% of the time (usually [employer]).    Estimated Daily Fluid Intake: 100+ oz Supplements: vitamin D, calcium, magnesium  GI / Other Notable Symptoms: heartburn   Physical Activity  Current average weekly physical activity: long run (1x/week),  runs/sprints (1x/week), circuits (1x/week)   Estimated Energy Needs Calories: 2200  Carbohydrate: 248g Protein: 165g Fat: 61g  Body Composition Scale Daily Caloric Needs: 2100 kcals (very light) - 3300 kcals (very heavy activity)    NUTRITION DIAGNOSIS  Diagnosis on 10/03/2018: Food and nutrition related knowledge deficit (NB-1.1) related to limited prior nutrition-related education as evidenced by verbalized incomplete information and no prior knowledge of need for nutrition related recommendations.  Status: Continue  NUTRITION INTERVENTION  Nutrition education (E-1) on the following topics:  Body Composition   Beverage Ideas (to help reduce intake of artificial sweeteners)   Handouts Provided Include   Body Composition Scale  Learning Style & Readiness for Change  Teaching method utilized: Visual & Auditory  Demonstrated degree of understanding via: Teach Back  Barriers to learning/adherence to lifestyle change: Weight Focused   Progress/Updates on Pt's Goals . Will choose diet soda over regular.  . Using the "MyPlate" guide to build meals including non-starchy vegetables, whole grains, and lean proteins.  . Increased vegetable intake and chooses whole grain bread/wrap/english muffins.  . Maintaining physical activity.  . Avoiding desserts and other sources of added sugar as much as possible.    MONITORING  & EVALUATION Dietary intake, weekly physical activity, and HgbA1c in 4 months.  RD's Notes for Next Visit . Pt plans to have HgbA1c checked in February . Consistent carbohydrate intake    Next Steps  Patient is to return to [facility] for follow up visit in February 2021.

## 2019-04-01 NOTE — Patient Instructions (Signed)
Great job on all of your progress thus far! I hope the Body Composition Scale reading helps to see what good things are going on inside, especially staying so well hydrated.   Here are the brands I was telling you about for beverage ideas:   Nuun  Amazing Grass  Keep up the good work and I will see you in February! Please reach out in the meantime with questions or concerns.

## 2019-09-23 ENCOUNTER — Other Ambulatory Visit: Payer: Self-pay

## 2019-09-23 ENCOUNTER — Encounter: Payer: 59 | Attending: Family Medicine | Admitting: Dietician

## 2019-09-23 ENCOUNTER — Encounter: Payer: Self-pay | Admitting: Dietician

## 2019-09-23 DIAGNOSIS — R7303 Prediabetes: Secondary | ICD-10-CM | POA: Insufficient documentation

## 2019-09-23 NOTE — Patient Instructions (Signed)
   Remember to keep carbohydrate intake as consistent as possible throughout the day. Carbohydrate foods are always okay to have, just remember to limit added sugars and to not have a lot of carbohydrates at one time.   Balance each meal and snack with a source of protein if possible.   Try to increase fluid intake as much as possible, with at least 64 ounces per day (ideally at least 32 ounces of plain water.)   Use the Breakfast Ideas sheet for more breakfast options and the Meal Ideas sheet to help create new dinner recipes.

## 2019-09-23 NOTE — Progress Notes (Signed)
Medical Nutrition Therapy  Follow-Up Visit Appt Start Time: 8:05am   End Time: 9:00am  Primary concerns today: prediabetes, weight management    Patient was initially seen for a nutrition assessment in March 2020 for prediabetes and weight management, then follow up appointments May and September 2020. Patient recently had blood work done in February 2021 and would like to review his labs today.    NUTRITION ASSESSMENT   Anthropometrics  Body Composition Scale 04/01/2019 09/23/2019  Weight  lbs  198.6 192.3  BMI  30.8 29.8  Total Body Fat % 28.1 26.4     Visceral Fat 16 15  Fat-Free Mass % 71.8 73.5     Total Body Water % 52.8 54.5     Muscle-Mass lbs 37.3 36.1  Body Fat Displacement           Torso  lbs 34.5 31.4         Left Leg  lbs 6.9 6.2         Right Leg  lbs 6.9 6.2         Left Arm  lbs 3.4 3.1         Right Arm   lbs 3.4 3.1   Biochemical/Labs A1c: 5.9% (was 6.3% 1 year ago)  Clinical Medical Hx: high cholesterol, impaired fasting glucose/prediabetes, HTN, GERD, obesity, depression, NEW: thyroid disease  Nutrition/Lifestyle Hx Patient states he is glad his A1c has gone down but still upset it is considered "prediabetes." States he has lost 40 lbs within the past year and is having trouble with continuing to lose weight, states the "first 20 lbs was easy." States he fasts on Monday nights. Patient states he does very well with eating consistently and has tried to cut out snacking. States he eats [employer] once per week and red meat once per week. States he now takes iron supplement because his levels are consistently low.  States his meals can get boring and likes to look forward to meals/treats to reward himself for the hard work he does in eating well and staying physically active.   24-Hr Dietary Recall First Meal: whole wheat english muffin + cheese + egg + black forrest ham + banana Snack: - Second Meal: whole wheat wrap + [place] ham + cheese + mayo +  tomatoes + broccoli + carrots + ranch dressing Snack: banana  Third Meal: "MyPlate meal" vegetables + starch + protein (primarily chicken)    Snack: - Beverages: caffeine free diet Coke (40 oz/day), water (20 oz/day)  Estimated Daily Fluid Intake: ~60 oz Supplements: vitamin D, calcium, magnesium, NEW: iron  Physical Activity  Current average weekly physical activity: long run (1x/week), Celene Pippins runs/sprints (1x/week), circuits (1x/week)   Estimated Energy Needs Calories: 2200  Carbohydrate: 248g Protein: 165g Fat: 61g  Body Composition Scale Daily Caloric Needs: 2000 kcals (very light) - 3200 kcals (very heavy activity)    NUTRITION DIAGNOSIS  10/03/2018: Food and nutrition related knowledge deficit (NB-1.1) related to limited prior nutrition-related education as evidenced by verbalized incomplete information and no prior knowledge of need for nutrition related recommendations.    NUTRITION INTERVENTION  Nutrition education (E-1) on the following topics:  Consistent carbohydrate intake and how this helps to manage blood sugar levels throughout the day   Balancing each meal and snack with a source of protein   Importance of fiber  Handouts Provided Include   Ways to Control Blood Glucose   Breakfast Ideas   Meal Ideas   Learning Style & Readiness  for Change Teaching method utilized: Visual & Auditory  Demonstrated degree of understanding via: Teach Back  Barriers to learning/adherence to lifestyle change: Weight Focused     MONITORING & EVALUATION Dietary intake, weekly physical activity, and HbA1c PRN.  Next Steps  Patient is to return to [facility] for follow up visit as needed/desired by patient or as recommended per MD.

## 2020-09-24 ENCOUNTER — Ambulatory Visit: Payer: 59 | Admitting: Dietician

## 2020-09-27 ENCOUNTER — Encounter: Payer: Self-pay | Admitting: Dietician

## 2020-09-27 ENCOUNTER — Other Ambulatory Visit: Payer: Self-pay

## 2020-09-27 ENCOUNTER — Encounter: Payer: 59 | Attending: Physician Assistant | Admitting: Dietician

## 2020-09-27 VITALS — Ht 66.0 in | Wt 198.5 lb

## 2020-09-27 DIAGNOSIS — E669 Obesity, unspecified: Secondary | ICD-10-CM | POA: Insufficient documentation

## 2020-09-27 NOTE — Progress Notes (Signed)
Medical Nutrition Therapy  Follow-Up Visit Appt Start Time: 3:30 pm   End Time: 4:15 pm  Primary concerns today: prediabetes, weight management    Patient was initially seen for a nutrition assessment in March 2020 for prediabetes and weight management, then follow up appointments May and September 2020. Patient recently had blood work done in February 2021 and would like to review his labs today.    NUTRITION ASSESSMENT   Anthropometrics  Body Composition Scale 04/01/2019 09/23/2019 09/27/2020  Weight  lbs  198.6 192.3 198.5  BMI  30.8 29.8 30.8  Total Body Fat % 28.1 26.4 25.3     Visceral Fat 16 15 16   Fat-Free Mass % 71.8 73.5 74.6     Total Body Water % 52.8 54.5 55.6     Muscle-Mass lbs 37.3 36.1 37.3  Body Fat Displacement            Torso  lbs 34.5 31.4 37.3         Left Leg  lbs 6.9 6.2 6.2         Right Leg  lbs 6.9 6.2 6.2         Left Arm  lbs 3.4 3.1 3.1         Right Arm   lbs 3.4 3.1 3.1   Biochemical/Labs 09/17/2020 A1c: 5.8%  GFR - 55 Creatinine - 1.39 Cholesterol - 217   Clinical Medical Hx: high cholesterol, impaired fasting glucose/prediabetes, HTN, GERD, obesity, depression, NEW: thyroid disease  Nutrition/Lifestyle Hx  Pt reports kidney values were not good and has been drinking a lot of water. Pt reports running 20 miles the morning before getting blood work done. Pt sodium and albumin values were at the high end of the range when labs were taken. Pt states their A1c was in the low 5's a few months ago and they got sick and now it is back to 5.8. Pt states they want to get down to 185 lbs. Pt has a strong focus on losing weight. Pt reports fasting to try to lose weight. Pt reports high stress with work recently. Pt reports being very physically active, runs long distances for over an hour, hour and a half. Pt reports playing [place] on the [place].   24-Hr Dietary Recall First Meal: 3 pancakes, sugar free syrup, 2 eggs, 2 strips bacon, 1  banana, 48 oz water Snack: - Second Meal: none Snack:   Third Meal: Chicken and steak fajitas, water Snack: - none Beverages: caffeine free diet Coke (40 oz/day), water 80 oz/day)  Estimated Daily Fluid Intake: ~60 oz Supplements: vitamin D, calcium, magnesium, NEW: iron  Physical Activity  Current average weekly physical activity: long run (1x/week), short runs/sprints (1x/week), circuits (1x/week)   Estimated Energy Needs Calories: 2200  Carbohydrate: 248g Protein: 165g Fat: 61g  Body Composition Scale Daily Caloric Needs: 2000 kcals (very light) - 3200 kcals (very heavy activity)    NUTRITION DIAGNOSIS  10/03/2018: Food and nutrition related knowledge deficit (NB-1.1) related to limited prior nutrition-related education as evidenced by verbalized incomplete information and no prior knowledge of need for nutrition related recommendations.    NUTRITION INTERVENTION  Nutrition education (E-1) on the following topics:  Consistent carbohydrate intake and how this helps to manage blood sugar levels throughout the day   Balancing each meal and snack with a source of protein   Timing of carbohydrate in relation to exercise.   Handouts Provided Include   Ways to Control Blood Glucose  Breakfast Ideas   Meal Ideas   Patient Goals:  Work towards eating three meals a day, about 5-6 hours apart!  Begin to build your meals using the proportions of the Balanced Plate.  First, select your carb choice for the meal  Next, select your source of protein to pair with your carb choice.  Finally, complete the remaining half of your meal with a variety of non-starchy vegetables.  Keep your heart rate between 114 to 147 for at least 20 minutes to maximize fat burning.   Have a small gatorade or glucose gels at around 45 minutes into a run if you run for more than an hour.  Have a high carb, high protein meal within an hour after your run. Limit high fat items before and after  exercise.  Have a carb and protein pairing for a snack.   Learning Style & Readiness for Change Teaching method utilized: Visual & Auditory  Demonstrated degree of understanding via: Teach Back  Barriers to learning/adherence to lifestyle change: Weight Focused     MONITORING & EVALUATION Dietary intake, weekly physical activity, and meal pattern in 3 months  Next Steps  Patient is to return to [facility] for follow up visit as needed/desired by patient or as recommended per MD.

## 2020-09-27 NOTE — Patient Instructions (Addendum)
Work towards eating three meals a day, about 5-6 hours apart!  Begin to build your meals using the proportions of the Balanced Plate. . First, select your carb choice for the meal . Next, select your source of protein to pair with your carb choice. . Finally, complete the remaining half of your meal with a variety of non-starchy vegetables.  Keep your heart rate between 114 to 147 for at least 20 minutes to maximize fat burning.   Have a small gatorade or glucose gels at around 45 minutes into a run if you run for more than an hour.  Have a high carb, high protein meal within an hour after your run. Limit high fat items before and after exercise.  Have a carb and protein pairing for a snack.

## 2021-01-24 ENCOUNTER — Ambulatory Visit: Payer: 59 | Admitting: Dietician

## 2022-10-05 ENCOUNTER — Other Ambulatory Visit: Payer: Self-pay | Admitting: Gastroenterology

## 2022-10-05 DIAGNOSIS — D175 Benign lipomatous neoplasm of intra-abdominal organs: Secondary | ICD-10-CM

## 2022-11-01 ENCOUNTER — Ambulatory Visit
Admission: RE | Admit: 2022-11-01 | Discharge: 2022-11-01 | Disposition: A | Discharge status: Home / Self Care | Payer: BC Managed Care – PPO | Source: Ambulatory Visit | Attending: Gastroenterology | Admitting: Gastroenterology

## 2022-11-01 DIAGNOSIS — D175 Benign lipomatous neoplasm of intra-abdominal organs: Secondary | ICD-10-CM

## 2022-11-01 MED ORDER — IOPAMIDOL (ISOVUE-300) INJECTION 61%
100.0000 mL | Freq: Once | INTRAVENOUS | Status: AC | PRN
  Administered 2022-11-01: 100 mL via INTRAVENOUS

## 2023-09-05 ENCOUNTER — Ambulatory Visit (HOSPITAL_BASED_OUTPATIENT_CLINIC_OR_DEPARTMENT_OTHER): Payer: BC Managed Care – PPO | Admitting: Cardiology

## 2023-09-05 ENCOUNTER — Encounter (HOSPITAL_BASED_OUTPATIENT_CLINIC_OR_DEPARTMENT_OTHER): Payer: Self-pay | Admitting: Cardiology

## 2023-09-05 VITALS — BP 128/74 | HR 56 | Ht 66.0 in | Wt 209.0 lb

## 2023-09-05 DIAGNOSIS — R001 Bradycardia, unspecified: Secondary | ICD-10-CM

## 2023-09-05 DIAGNOSIS — Z8249 Family history of ischemic heart disease and other diseases of the circulatory system: Secondary | ICD-10-CM

## 2023-09-05 DIAGNOSIS — I471 Supraventricular tachycardia, unspecified: Secondary | ICD-10-CM | POA: Diagnosis not present

## 2023-09-05 DIAGNOSIS — R002 Palpitations: Secondary | ICD-10-CM

## 2023-09-05 NOTE — Patient Instructions (Signed)
 Medication Instructions:  Your physician recommends that you continue on your current medications as directed. Please refer to the Current Medication list given to you today.  *If you need a refill on your cardiac medications before your next appointment, please call your pharmacy*  Testing/Procedures: Your physician has requested that you have an echocardiogram. Echocardiography is a painless test that uses sound waves to create images of your heart. It provides your doctor with information about the size and shape of your heart and how well your heart's chambers and valves are working. This procedure takes approximately one hour. There are no restrictions for this procedure. Please do NOT wear cologne, perfume, aftershave, or lotions (deodorant is allowed). Please arrive 15 minutes prior to your appointment time.  Please note: We ask at that you not bring children with you during ultrasound (echo/ vascular) testing. Due to room size and safety concerns, children are not allowed in the ultrasound rooms during exams. Our front office staff cannot provide observation of children in our lobby area while testing is being conducted. An adult accompanying a patient to their appointment will only be allowed in the ultrasound room at the discretion of the ultrasound technician under special circumstances. We apologize for any inconvenience.   Follow-Up: At [facility], you and your health needs are our priority.  As part of our continuing mission to provide you with exceptional heart care, we have created designated Provider Care Teams.  These Care Teams include your primary Cardiologist (physician) and Advanced Practice Providers (APPs -  Physician Assistants and Nurse Practitioners) who all work together to provide you with the care you need, when you need it.  We recommend signing up for the patient portal called MyChart.  Sign up information is provided on this After Visit Summary.  MyChart  is used to connect with patients for Virtual Visits (Telemedicine).  Patients are able to view lab/test results, encounter notes, upcoming appointments, etc.  Non-urgent messages can be sent to your provider as well.   To learn more about what you can do with MyChart, go to [email or web address].    Your next appointment:   6 month(s)  Provider:   Shelda Bruckner, MD    Other Instructions

## 2023-09-05 NOTE — Progress Notes (Signed)
 Cardiology Office Note:  .   Date:  09/05/2023  ID:  Roger Shepherd, DOB 04-Nov-1975, MRN [id number] PCP: Genella Harlene GORMAN DEVONNA  [facility] Providers Cardiologist:  Shelda Bruckner, MD {  History of Present Illness: .   Roger Shepherd is a 48 y.o. male with PMH hyperlipidemia, prediabetes. He is seen 09/05/23 as a new consult for SVT.  Today: Referred as a new consult for SVT. Notes, testing reviewed from Dr. Marvene.  Five years ago, was diagnosed with prediabetes. Lost weight, but has regained some. Has never been true diabetic. Was doing well, then in October had back pain. After this, started having palpitations. Symptoms were positional, worse laying on the right side.  Wore a Zio monitor (scan available in media tab/note). Reviewed today. HR sinus range 35-149, avg 53 sinus. Had 210 episodes of SVT, longest episode 1 min 44 seconds, fastest rate 162 bpm. No VT, pauses, afib. PAC/PVC burden. We reviewed his monitor together.  Can row 7 miles, run 5 miles, do 150 burpees, etc, without issues. Is a blood donor long term.  Drinks some caffeine (not sure how much--varies). Some days he can have little to none (a diet pepsi at lunch), sometimes can be more like 2-3 diet pepsis. No coffee. No energy drinks.  Grandfather died of MI. Father is 61, has a murmur. Mother had a murmur, died of a stroke.  Has only passed out once 25 years ago when he was ill.  ROS: Denies chest pain, shortness of breath at rest or with normal exertion. No PND, orthopnea, LE edema or unexpected weight gain. No syncope. ROS otherwise negative except as noted.   Studies Reviewed: SABRA    EKG:  EKG Interpretation Date/Time:  Wednesday September 05 2023 16:01:26 EST Ventricular Rate:  48 PR Interval:  190 QRS Duration:  92 QT Interval:  458 QTC Calculation: 409 R Axis:   51  Text Interpretation: Sinus bradycardia Confirmed by Bruckner Shelda [phone]) on 09/05/2023 4:33:33 PM     Physical Exam:   VS:  BP 128/74   Pulse (!) 56   Ht 5' 6 (1.676 m)   Wt 209 lb (94.8 kg)   SpO2 96%   BMI 33.73 kg/m    Wt Readings from Last 3 Encounters:  09/05/23 209 lb (94.8 kg)  09/27/20 198 lb 8 oz (90 kg)  09/23/19 192 lb 4.8 oz (87.2 kg)    GEN: Well nourished, well developed in no acute distress HEENT: Normal, moist mucous membranes NECK: No JVD CARDIAC: regular rhythm, normal S1 and S2, no rubs or gallops. No murmur. VASCULAR: Radial and DP pulses 2+ bilaterally. No carotid bruits RESPIRATORY:  Clear to auscultation without rales, wheezing or rhonchi  ABDOMEN: Soft, non-tender, non-distended MUSCULOSKELETAL:  Ambulates independently SKIN: Warm and dry, no edema NEUROLOGIC:  Alert and oriented x 3. No focal neuro deficits noted. PSYCHIATRIC:  Normal affect    ASSESSMENT AND PLAN: .    Paroxysmal SVT Palpitations -reviewed his monitor results at length together today. Most symptomatic events were SVT, though some were sinus without arrhythmia -given his chronic sinus bradycardia, likely will not tolerate standing dose of beta blocker or calcium channel blocker -given brevity of his episodes, PRN dosing likely not helpful -discussed antiarrhythmic vs ablation based on how symptomatic he is. Discussed referral to Ep for further discussion. After shared decision making, he would like to continue to monitor symptoms for now and reassess in the future. -will check echo to rule out  structural abnormality  Sinus bradycardia due to high fitness level Obesity -very active, working on weight loss -no indication for pacemaker (this was a big concern for him)  CV risk counseling and prevention -recommend heart healthy/Mediterranean diet, with whole grains, fruits, vegetable, fish, lean meats, nuts, and olive oil. Limit salt. -recommend moderate walking, 3-5 times/week for 30-50 minutes each session. Aim for at least 150 minutes.week. Goal should be pace of 3 miles/hours,  or walking 1.5 miles in 30 minutes -recommend avoidance of tobacco products. Avoid excess alcohol.  Dispo: 6 months  Signed, Shelda Bruckner, MD   Shelda Bruckner, MD, PhD, [facility]  [facility]  [facility] [address] [place], [place] [address] [phone]

## 2023-10-09 ENCOUNTER — Ambulatory Visit (HOSPITAL_BASED_OUTPATIENT_CLINIC_OR_DEPARTMENT_OTHER): Payer: BC Managed Care – PPO

## 2023-10-09 DIAGNOSIS — I471 Supraventricular tachycardia, unspecified: Secondary | ICD-10-CM

## 2023-10-09 LAB — ECHOCARDIOGRAM COMPLETE
Area-P 1/2: 3.77 cm2
S' Lateral: 3.51 cm

## 2023-10-10 ENCOUNTER — Encounter (HOSPITAL_BASED_OUTPATIENT_CLINIC_OR_DEPARTMENT_OTHER): Payer: Self-pay
